# Patient Record
Sex: Male | Born: 1970 | Race: White | Hispanic: No | Marital: Single | Smoking: Never smoker
Health system: Southern US, Community
[De-identification: ages and names within clinical notes are randomized; demographics above are authoritative.]

---

## 2014-09-15 ENCOUNTER — Ambulatory Visit (INDEPENDENT_AMBULATORY_CARE_PROVIDER_SITE_OTHER): Payer: BLUE CROSS/BLUE SHIELD | Admitting: Sports Medicine

## 2014-09-15 VITALS — BP 148/86 | HR 88 | Resp 18 | Ht 75.0 in | Wt 236.0 lb

## 2014-09-15 DIAGNOSIS — R635 Abnormal weight gain: Secondary | ICD-10-CM | POA: Diagnosis not present

## 2014-09-15 DIAGNOSIS — E663 Overweight: Secondary | ICD-10-CM

## 2014-09-15 MED ORDER — PHENTERMINE HCL 37.5 MG PO TABS: ORAL_TABLET | ORAL | Status: DC

## 2014-09-15 NOTE — Assessment & Plan Note (Signed)
Fayrene FearingJames has struggled with weight loss, he has a voracious appetite, he is exercising, he's tried diet, he does a good amount of resistance training. At this point he is a candidate for pharmacologic weight loss, he is in the overweight category, with orthopedic complaints, making him a candidate. Starting phentermine 37.5 mg daily, return monthly for weight checks and refills.

## 2014-09-15 NOTE — Progress Notes (Signed)
  Subjective:    CC: weight loss  HPI:  This is a pleasant 44 year old male,He is healthy, and one of our pharmaceutical reps. He has struggled for years with losing weight, he is trying to diet, exercises regularly, but weight has been stagnant. He is amenable to try pharmacologic weight loss. He has no cardiac history, he has mild hypertension that is controlled. He is also motivated. He also has bilateral knee pain likely related to his weight.  Past medical history, Surgical history, Family history not pertinant except as noted below, Social history, Allergies, and medications have been entered into the medical record, reviewed, and no changes needed.   Review of Systems: No headache, visual changes, nausea, vomiting, diarrhea, constipation, dizziness, abdominal pain, skin rash, fevers, chills, night sweats, swollen lymph nodes, weight loss, chest pain, body aches, joint swelling, muscle aches, shortness of breath, mood changes, visual or auditory hallucinations.  Objective:    General: Well Developed, well nourished, and in no acute distress.  Neuro: Alert and oriented x3, extra-ocular muscles intact, sensation grossly intact.  HEENT: Normocephalic, atraumatic, pupils equal round reactive to light, neck supple, no masses, no lymphadenopathy, thyroid nonpalpable.  Skin: Warm and dry, no rashes noted.  Cardiac: Regular rate and rhythm, no murmurs rubs or gallops.  Respiratory: Clear to auscultation bilaterally. Not using accessory muscles, speaking in full sentences.  Abdominal: Soft, nontender, nondistended, positive bowel sounds, no masses, no organomegaly.  Musculoskeletal: Shoulder, elbow, wrist, hip, knee, ankle stable, and with full range of motion.  Impression and Recommendations:    The patient was counselled, risk factors were discussed, anticipatory guidance given.

## 2016-07-25 ENCOUNTER — Ambulatory Visit (INDEPENDENT_AMBULATORY_CARE_PROVIDER_SITE_OTHER): Payer: BLUE CROSS/BLUE SHIELD | Admitting: Sports Medicine

## 2016-07-25 ENCOUNTER — Ambulatory Visit (INDEPENDENT_AMBULATORY_CARE_PROVIDER_SITE_OTHER): Payer: BLUE CROSS/BLUE SHIELD

## 2016-07-25 DIAGNOSIS — M25561 Pain in right knee: Secondary | ICD-10-CM | POA: Diagnosis not present

## 2016-07-25 DIAGNOSIS — M542 Cervicalgia: Secondary | ICD-10-CM

## 2016-07-25 DIAGNOSIS — I10 Essential (primary) hypertension: Secondary | ICD-10-CM | POA: Diagnosis not present

## 2016-07-25 DIAGNOSIS — M1711 Unilateral primary osteoarthritis, right knee: Secondary | ICD-10-CM

## 2016-07-25 DIAGNOSIS — E291 Testicular hypofunction: Secondary | ICD-10-CM

## 2016-07-25 DIAGNOSIS — M25562 Pain in left knee: Secondary | ICD-10-CM | POA: Diagnosis not present

## 2016-07-25 MED ORDER — AMLODIPINE BESYLATE 10 MG PO TABS: 5.0000 mg | ORAL_TABLET | Freq: Every day | ORAL | 3 refills | Status: DC

## 2016-07-25 MED ORDER — MELOXICAM 15 MG PO TABS: ORAL_TABLET | ORAL | 3 refills | Status: DC

## 2016-07-25 NOTE — Assessment & Plan Note (Signed)
Xrays, mobic.

## 2016-07-25 NOTE — Assessment & Plan Note (Signed)
Mild ED with losartan/HCTZ, switching to amlodipine, we will start with a half of a 10 mg tab, blood pressures before medication ran in the 160s to 170s. Return in 2 weeks for a blood pressure recheck and to consider bumping up to 10 mg. Checking routine blood work as well.

## 2016-07-25 NOTE — Progress Notes (Signed)
  Subjective:    CC: Follow-up  HPI: Hypertension: Currently doing losartan/HCTZ, some resultant erectile dysfunction. Blood pressures are running in the 130s over 90s. Would like to switch to something else. Does get occasional headaches and neck pain when blood pressure is high. No chest pain, visual changes.  Knee pain: Right-sided, patellofemoral, gelling, occasional grinding but no catching, locking. Moderate, persistent.  Neck pain: At the base of the spine, worse with turning the neck from side-to-side with occasional grinding. Has never had imaging.  Past medical history:  Negative.  See flowsheet/record as well for more information.  Surgical history: Negative.  See flowsheet/record as well for more information.  Family history: Negative.  See flowsheet/record as well for more information.  Social history: Negative.  See flowsheet/record as well for more information.  Allergies, and medications have been entered into the medical record, reviewed, and no changes needed.   Review of Systems: No fevers, chills, night sweats, weight loss, chest pain, or shortness of breath.   Objective:    General: Well Developed, well nourished, and in no acute distress.  Neuro: Alert and oriented x3, extra-ocular muscles intact, sensation grossly intact.  HEENT: Normocephalic, atraumatic, pupils equal round reactive to light, neck supple, no masses, no lymphadenopathy, thyroid nonpalpable.  Skin: Warm and dry, no rashes. Cardiac: Regular rate and rhythm, no murmurs rubs or gallops, no lower extremity edema.  Respiratory: Clear to auscultation bilaterally. Not using accessory muscles, speaking in full sentences. Neck: Negative spurling's Full neck range of motion Grip strength and sensation normal in bilateral hands Strength good C4 to T1 distribution No sensory change to C4 to T1 Reflexes normal  Impression and Recommendations:    Benign essential hypertension Mild ED with losartan/HCTZ,  switching to amlodipine, we will start with a half of a 10 mg tab, blood pressures before medication ran in the 160s to 170s. Return in 2 weeks for a blood pressure recheck and to consider bumping up to 10 mg. Checking routine blood work as well.   Neck pain Suspect cervical spondylosis, getting x-rays, neck rehabilitation exercises, meloxicam.   Primary osteoarthritis of right knee Xrays, mobic.

## 2016-07-25 NOTE — Assessment & Plan Note (Signed)
Suspect cervical spondylosis, getting x-rays, neck rehabilitation exercises, meloxicam.

## 2016-07-26 LAB — COMPREHENSIVE METABOLIC PANEL
ALT: 28 U/L (ref 9–46)
Albumin: 4.4 g/dL (ref 3.6–5.1)
Alkaline Phosphatase: 68 U/L (ref 40–115)
CO2: 27 mmol/L (ref 20–31)
Calcium: 9.4 mg/dL (ref 8.6–10.3)
Creat: 0.98 mg/dL (ref 0.60–1.35)
Potassium: 4.2 mmol/L (ref 3.5–5.3)
Sodium: 139 mmol/L (ref 135–146)
Total Protein: 7.6 g/dL (ref 6.1–8.1)

## 2016-07-26 LAB — LIPID PANEL W/REFLEX DIRECT LDL
Cholesterol: 212 mg/dL — ABNORMAL HIGH (ref <200)
HDL: 49 mg/dL (ref >40)
LDL-Cholesterol: 141 mg/dL — ABNORMAL HIGH
Non-HDL Cholesterol (Calc): 163 mg/dL — ABNORMAL HIGH (ref <130)
Total CHOL/HDL Ratio: 4.3 ratio (ref <5.0)
Triglycerides: 108 mg/dL (ref <150)

## 2016-07-26 LAB — CBC
HCT: 42.8 % (ref 38.5–50.0)
Hemoglobin: 14.6 g/dL (ref 13.2–17.1)
MCH: 31.1 pg (ref 27.0–33.0)
MCHC: 34.1 g/dL (ref 32.0–36.0)
MCV: 91.3 fL (ref 80.0–100.0)
MPV: 10.4 fL (ref 7.5–12.5)
Platelets: 210 K/uL (ref 140–400)
RBC: 4.69 MIL/uL (ref 4.20–5.80)
RDW: 13.2 % (ref 11.0–15.0)
WBC: 6.8 K/uL (ref 3.8–10.8)

## 2016-07-26 LAB — TSH: TSH: 1.58 m[IU]/L (ref 0.40–4.50)

## 2016-07-26 LAB — COMPREHENSIVE METABOLIC PANEL WITH GFR
AST: 24 U/L (ref 10–40)
BUN: 17 mg/dL (ref 7–25)
Chloride: 102 mmol/L (ref 98–110)
Glucose, Bld: 94 mg/dL (ref 65–99)
Total Bilirubin: 0.7 mg/dL (ref 0.2–1.2)

## 2016-07-27 LAB — HEMOGLOBIN A1C
Hgb A1c MFr Bld: 5.1 % (ref <5.7)
Mean Plasma Glucose: 100 mg/dL

## 2016-07-27 LAB — VITAMIN D 25 HYDROXY (VIT D DEFICIENCY, FRACTURES): Vit D, 25-Hydroxy: 23 ng/mL — ABNORMAL LOW (ref 30–100)

## 2016-07-29 LAB — TESTOSTERONE TOTAL,FREE,BIO, MALES
Albumin: 4.4 g/dL (ref 3.6–5.1)
Sex Hormone Binding: 13 nmol/L (ref 10–50)
Testosterone, Bioavailable: 128.3 ng/dL (ref 110.0–575.0)
Testosterone, Free: 63.7 pg/mL (ref 46.0–224.0)
Testosterone: 264 ng/dL (ref 250–827)

## 2016-07-30 DIAGNOSIS — E291 Testicular hypofunction: Secondary | ICD-10-CM | POA: Insufficient documentation

## 2016-07-30 NOTE — Addendum Note (Signed)
Addended by: Monica Becton on: 07/30/2016 08:34 AM   Modules accepted: Orders

## 2016-07-30 NOTE — Assessment & Plan Note (Addendum)
Testosterone is borderline low, rechecking.  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

## 2016-08-01 LAB — TESTOSTERONE TOTAL,FREE,BIO, MALES
Albumin: 4.6 g/dL (ref 3.6–5.1)
Sex Hormone Binding: 14 nmol/L (ref 10–50)
Testosterone: 247 ng/dL — ABNORMAL LOW (ref 250–827)

## 2016-08-01 MED ORDER — TESTOSTERONE 40.5 MG/2.5GM (1.62%) TD GEL: 1.0000 "application " | Freq: Every day | TRANSDERMAL | 0 refills | Status: DC

## 2016-08-01 NOTE — Addendum Note (Signed)
Addended by: Monica Becton on: 08/01/2016 04:29 PM   Modules accepted: Orders

## 2016-08-06 ENCOUNTER — Telehealth: Payer: Self-pay | Admitting: *Deleted

## 2016-08-06 NOTE — Telephone Encounter (Signed)
Pre Authorization sent to cover my meds.[id number] - PA Case ID: [id number]. Called and left message on pharm vm.

## 2016-09-23 ENCOUNTER — Ambulatory Visit (INDEPENDENT_AMBULATORY_CARE_PROVIDER_SITE_OTHER): Payer: BLUE CROSS/BLUE SHIELD | Admitting: Sports Medicine

## 2016-09-23 DIAGNOSIS — M1711 Unilateral primary osteoarthritis, right knee: Secondary | ICD-10-CM | POA: Diagnosis not present

## 2016-09-23 NOTE — Progress Notes (Signed)
  Subjective:    CC: Right knee swelling  HPI: This is a pleasant 46 year old male with a history of osteoarthritis, on x-ray. This weekend he was at a postpartum, had several beers, his knee then swelled up, we can free painful and stiff. Pain is moderate, persistent, localized mostly at the medial joint line without mechanical symptoms, he does get gelling.  Past medical history:  Negative.  See flowsheet/record as well for more information.  Surgical history: Negative.  See flowsheet/record as well for more information.  Family history: Negative.  See flowsheet/record as well for more information.  Social history: Negative.  See flowsheet/record as well for more information.  Allergies, and medications have been entered into the medical record, reviewed, and no changes needed.   Review of Systems: No fevers, chills, night sweats, weight loss, chest pain, or shortness of breath.   Objective:    General: Well Developed, well nourished, and in no acute distress.  Neuro: Alert and oriented x3, extra-ocular muscles intact, sensation grossly intact.  HEENT: Normocephalic, atraumatic, pupils equal round reactive to light, neck supple, no masses, no lymphadenopathy, thyroid nonpalpable.  Skin: Warm and dry, no rashes. Cardiac: Regular rate and rhythm, no murmurs rubs or gallops, no lower extremity edema.  Respiratory: Clear to auscultation bilaterally. Not using accessory muscles, speaking in full sentences. Right Knee: Normal to inspection with no erythema or effusion or obvious bony abnormalities. Mild effusion with a small fluid wave, tender to palpation at the medial joint line. ROM normal in flexion and extension and lower leg rotation. Ligaments with solid consistent endpoints including ACL, PCL, LCL, MCL. Negative Mcmurray's and provocative meniscal tests. Non painful patellar compression. Patellar and quadriceps tendons unremarkable. Hamstring and quadriceps strength is  normal.  Procedure: Real-time Ultrasound Guided aspiration/injection of right knee Device: GE Logiq E  Verbal informed consent obtained.  Time-out conducted.  Noted no overlying erythema, induration, or other signs of local infection.  Skin prepped in a sterile fashion.  Local anesthesia: Topical Ethyl chloride.  With sterile technique and under real time ultrasound guidance:  Aspirated 11 mL of clear, straw-colored fluid, syringe switched and 1 mL Kenalog 40, 2 mL lidocaine, 2 mL bupivacaine injected easily. Completed without difficulty  Pain immediately resolved suggesting accurate placement of the medication.  Advised to call if fevers/chills, erythema, induration, drainage, or persistent bleeding.  Images permanently stored and available for review in the ultrasound unit.  Impression: Technically successful ultrasound guided injection.  Impression and Recommendations:    Primary osteoarthritis of right knee Swelling after drinking some alcohol, consistent with gout. Arthrocentesis was performed with injection, the fluid did not look like gouty fluid. Crystal analysis ordered, injection performed, return in one month. If uric acid crystals isolated and we will check a serum uric acid level.

## 2016-09-23 NOTE — Assessment & Plan Note (Signed)
Swelling after drinking some alcohol, consistent with gout. Arthrocentesis was performed with injection, the fluid did not look like gouty fluid. Crystal analysis ordered, injection performed, return in one month. If uric acid crystals isolated and we will check a serum uric acid level.

## 2016-09-24 LAB — SYNOVIAL CELL COUNT + DIFF, W/ CRYSTALS
Basophils, %: 0 %
Eosinophils-Synovial: 0 % (ref 0–2)
Lymphocytes-Synovial Fld: 31 % (ref 0–74)
Monocyte/Macrophage: 52 % (ref 0–69)
Neutrophil, Synovial: 16 % (ref 0–24)
Synoviocytes, %: 1 % (ref 0–15)
WBC, Synovial: 126 cells/uL (ref <150)

## 2016-11-18 ENCOUNTER — Other Ambulatory Visit: Payer: Self-pay

## 2016-11-18 DIAGNOSIS — I10 Essential (primary) hypertension: Secondary | ICD-10-CM

## 2016-11-18 MED ORDER — AMLODIPINE BESYLATE 10 MG PO TABS: 10.0000 mg | ORAL_TABLET | Freq: Every day | ORAL | 3 refills | Status: DC

## 2017-01-13 ENCOUNTER — Other Ambulatory Visit: Payer: Self-pay | Admitting: Sports Medicine

## 2017-01-13 DIAGNOSIS — M542 Cervicalgia: Secondary | ICD-10-CM

## 2017-03-06 ENCOUNTER — Other Ambulatory Visit: Payer: Self-pay | Admitting: Sports Medicine

## 2017-03-06 DIAGNOSIS — M542 Cervicalgia: Secondary | ICD-10-CM

## 2017-04-10 ENCOUNTER — Telehealth: Payer: Self-pay

## 2017-04-10 NOTE — Telephone Encounter (Signed)
Pt states his bp has been running from 160/100 with medication would like to know what would be the next step without causing any side effects.

## 2017-04-11 MED ORDER — LISINOPRIL-HYDROCHLOROTHIAZIDE 20-25 MG PO TABS: ORAL_TABLET | ORAL | 3 refills | Status: DC

## 2017-04-11 NOTE — Telephone Encounter (Signed)
If this is while taking the 10 mg of amlodipine we do need to add an ACE inhibitor/diuretic combination.  I am going to add lisinopril/HCTZ, I am going to called in at the full dose and he should do a half dose for 1-2 weeks and if the blood pressure is still elevated above 120/80 go up to a full dose.  He also needs to let me know his blood pressures along the way.

## 2017-09-17 ENCOUNTER — Other Ambulatory Visit: Payer: Self-pay | Admitting: Sports Medicine

## 2017-09-17 MED ORDER — VALSARTAN-HYDROCHLOROTHIAZIDE 320-25 MG PO TABS: 1.0000 | ORAL_TABLET | Freq: Every day | ORAL | 3 refills | Status: DC

## 2017-09-17 NOTE — Telephone Encounter (Signed)
Cough on ACE, switching to ARB

## 2017-11-20 ENCOUNTER — Other Ambulatory Visit: Payer: Self-pay | Admitting: Sports Medicine

## 2017-11-27 ENCOUNTER — Telehealth: Payer: Self-pay | Admitting: Sports Medicine

## 2017-11-27 ENCOUNTER — Encounter: Payer: Self-pay | Admitting: Sports Medicine

## 2017-11-27 ENCOUNTER — Ambulatory Visit (INDEPENDENT_AMBULATORY_CARE_PROVIDER_SITE_OTHER): Payer: BLUE CROSS/BLUE SHIELD | Admitting: Sports Medicine

## 2017-11-27 DIAGNOSIS — M1711 Unilateral primary osteoarthritis, right knee: Secondary | ICD-10-CM | POA: Diagnosis not present

## 2017-11-27 NOTE — Progress Notes (Signed)
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  I injected his knee about 14 months ago, last month he had a recurrence of pain, moderate, persistent, localized at the medial joint line without radiation, no mechanical symptoms, moderate gelling.  He has tried oral NSAIDs, analgesics without much efficacy.  I reviewed the past medical history, family history, social history, surgical history, and allergies today and no changes were needed.  Please see the problem list section below in epic for further details.  Past Medical History: No past medical history on file. Past Surgical History: History reviewed. No pertinent surgical history. Social History: Social History   Socioeconomic History  . Marital status: Single    Spouse name: Not on file  . Number of children: Not on file  . Years of education: Not on file  . Highest education level: Not on file  Occupational History  . Not on file  Social Needs  . Financial resource strain: Not on file  . Food insecurity:    Worry: Not on file    Inability: Not on file  . Transportation needs:    Medical: Not on file    Non-medical: Not on file  Tobacco Use  . Smoking status: Never Smoker  . Smokeless tobacco: Never Used  Substance and Sexual Activity  . Alcohol use: Not Currently  . Drug use: Never  . Sexual activity: Not on file  Lifestyle  . Physical activity:    Days per week: Not on file    Minutes per session: Not on file  . Stress: Not on file  Relationships  . Social connections:    Talks on phone: Not on file    Gets together: Not on file    Attends religious service: Not on file    Active member of club or organization: Not on file    Attends meetings of clubs or organizations: Not on file    Relationship status: Not on file  Other Topics Concern  . Not on file  Social History Narrative  . Not on file   Family  History: No family history on file. Allergies: Allergies  Allergen Reactions  . Ace Inhibitors Cough   Medications: See med rec.  Review of Systems: No fevers, chills, night sweats, weight loss, chest pain, or shortness of breath.   Objective:    General: Well Developed, well nourished, and in no acute distress.  Neuro: Alert and oriented x3, extra-ocular muscles intact, sensation grossly intact.  HEENT: Normocephalic, atraumatic, pupils equal round reactive to light, neck supple, no masses, no lymphadenopathy, thyroid nonpalpable.  Skin: Warm and dry, no rashes. Cardiac: Regular rate and rhythm, no murmurs rubs or gallops, no lower extremity edema.  Respiratory: Clear to auscultation bilaterally. Not using accessory muscles, speaking in full sentences. Right knee: Normal to inspection with no erythema or effusion or obvious bony abnormalities. Tender to palpation at the medial joint line ROM normal in flexion and extension and lower leg rotation. Ligaments with solid consistent endpoints including ACL, PCL, LCL, MCL. Negative Mcmurray's and provocative meniscal tests. Non painful patellar compression. Patellar and quadriceps tendons unremarkable. Hamstring and quadriceps strength is normal.  Procedure: Real-time Ultrasound Guided Injection of right knee  device: GE Logiq E  Verbal informed consent obtained.  Time-out conducted.  Noted no overlying erythema, induration, or other signs of local infection.  Skin prepped in a sterile  fashion.  Local anesthesia: Topical Ethyl chloride.  With sterile technique and under real time ultrasound guidance: Using a 25-gauge needle advanced into the suprapatellar recess and injected 1 cc Kenalog 40, 2 cc lidocaine, 2 cc bupivacaine. Completed without difficulty  Pain immediately resolved suggesting accurate placement of the medication.  Advised to call if fevers/chills, erythema, induration, drainage, or persistent bleeding.  Images  permanently stored and available for review in the ultrasound unit.  Impression: Technically successful ultrasound guided injection.  Impression and Recommendations:    Primary osteoarthritis of right knee Previous injection was 14 months ago. Repeat right knee injection today, adding Pennsaid (diclofenac 2% topical), [facility] approval, rehab exercises. Return to see me as needed.  ___________________________________________ Ihor Austinhomas J. Benjamin Stainhekkekandam, M.D., ABFM., CAQSM. Primary Care and Sports Medicine [facility] [facility]  Adjunct Instructor of Family Medicine  [employer]

## 2017-11-27 NOTE — Telephone Encounter (Signed)
-----   Message from Neldon LabellaHolden S Mabe, CMA sent at 11/27/2017 11:15 AM EDT ----- Information has been submitted to [facility] and awaiting determination.   ----- Message ----- From: Monica Bectonhekkekandam, Thomas J, MD Sent: 11/27/2017   9:04 AM EDT To: Neldon LabellaHolden S Mabe, CMA  Orthovisc approval please, right knee, x-ray confirmed, failed steroid injections and NSAIDs. ___________________________________________ Ihor Austinhomas J. Benjamin Stainhekkekandam, M.D., ABFM., CAQSM. Primary Care and Sports Medicine [employer] [facility]  Adjunct Instructor of Family Medicine  [employer]

## 2017-11-27 NOTE — Assessment & Plan Note (Signed)
Previous injection was 14 months ago. Repeat right knee injection today, adding Pennsaid (diclofenac 2% topical), [facility] approval, rehab exercises. Return to see me as needed.

## 2017-12-11 NOTE — Telephone Encounter (Signed)
Patient wants to think over this and then discuss with Dr. Karie Schwalbe at a later date. He will call back to make an appointment if he decides to go ahead.

## 2017-12-11 NOTE — Telephone Encounter (Signed)
Orthovisc is not covered and I called insurance does not cover speciality injections. Please advise.   I have left the patient a message to call.

## 2017-12-11 NOTE — Telephone Encounter (Signed)
The next option is to try PRP, evidence is not great, and it is $470, generally not covered by insurance.  All of this is to try to keep him out of the operating room.

## 2018-02-01 ENCOUNTER — Other Ambulatory Visit: Payer: Self-pay | Admitting: Sports Medicine

## 2018-02-03 ENCOUNTER — Other Ambulatory Visit: Payer: Self-pay | Admitting: Sports Medicine

## 2018-02-03 DIAGNOSIS — I10 Essential (primary) hypertension: Secondary | ICD-10-CM

## 2018-03-01 ENCOUNTER — Other Ambulatory Visit: Payer: Self-pay | Admitting: Sports Medicine

## 2018-03-02 ENCOUNTER — Other Ambulatory Visit: Payer: Self-pay | Admitting: Sports Medicine

## 2018-03-02 MED ORDER — VALSARTAN-HYDROCHLOROTHIAZIDE 320-25 MG PO TABS: 1.0000 | ORAL_TABLET | Freq: Every day | ORAL | 3 refills | Status: DC

## 2018-04-03 ENCOUNTER — Other Ambulatory Visit: Payer: Self-pay | Admitting: Sports Medicine

## 2018-05-09 ENCOUNTER — Other Ambulatory Visit: Payer: Self-pay | Admitting: Sports Medicine

## 2018-05-15 ENCOUNTER — Other Ambulatory Visit: Payer: Self-pay | Admitting: Sports Medicine

## 2018-11-26 IMAGING — DX DG CERVICAL SPINE COMPLETE 4+V
6 series · 6 of 6 positions shown · non-contrast
Comparison: No prior.

CLINICAL DATA: Pain at base of skull.  No known injury.

EXAM:
CERVICAL SPINE - COMPLETE 4+ VIEW

[c-spine lat]
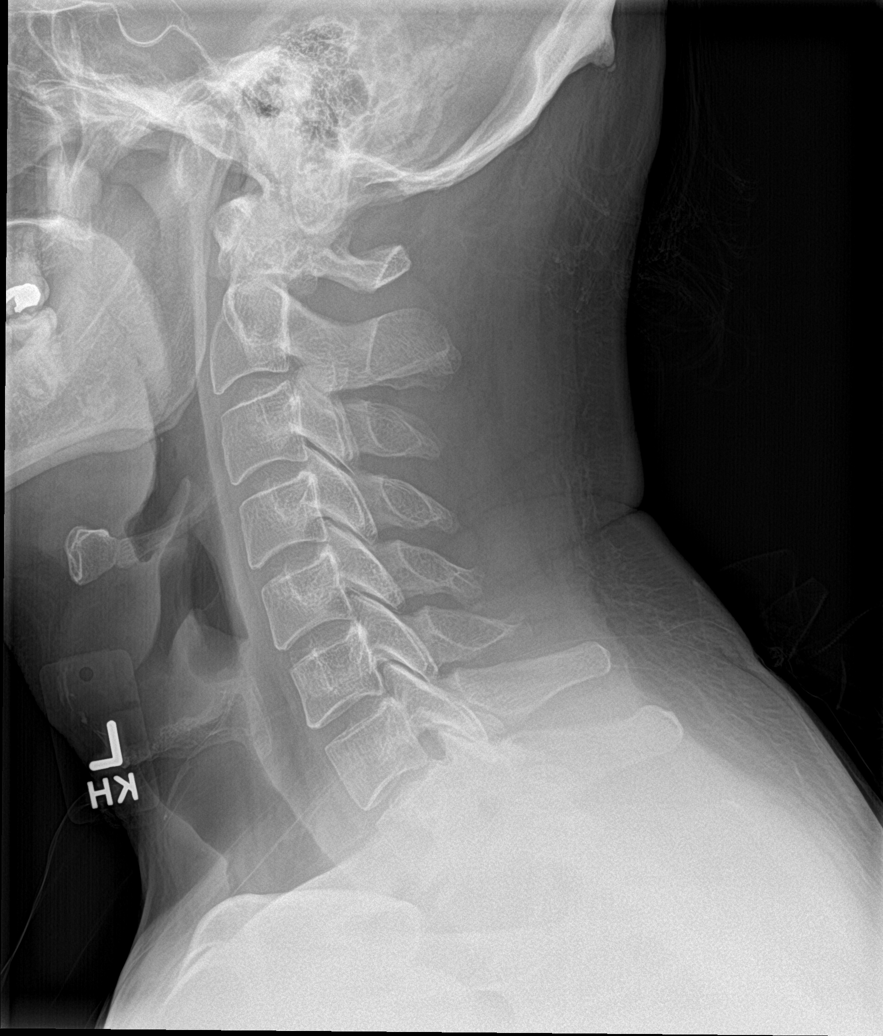

[c-spine obl (1 of 2)]
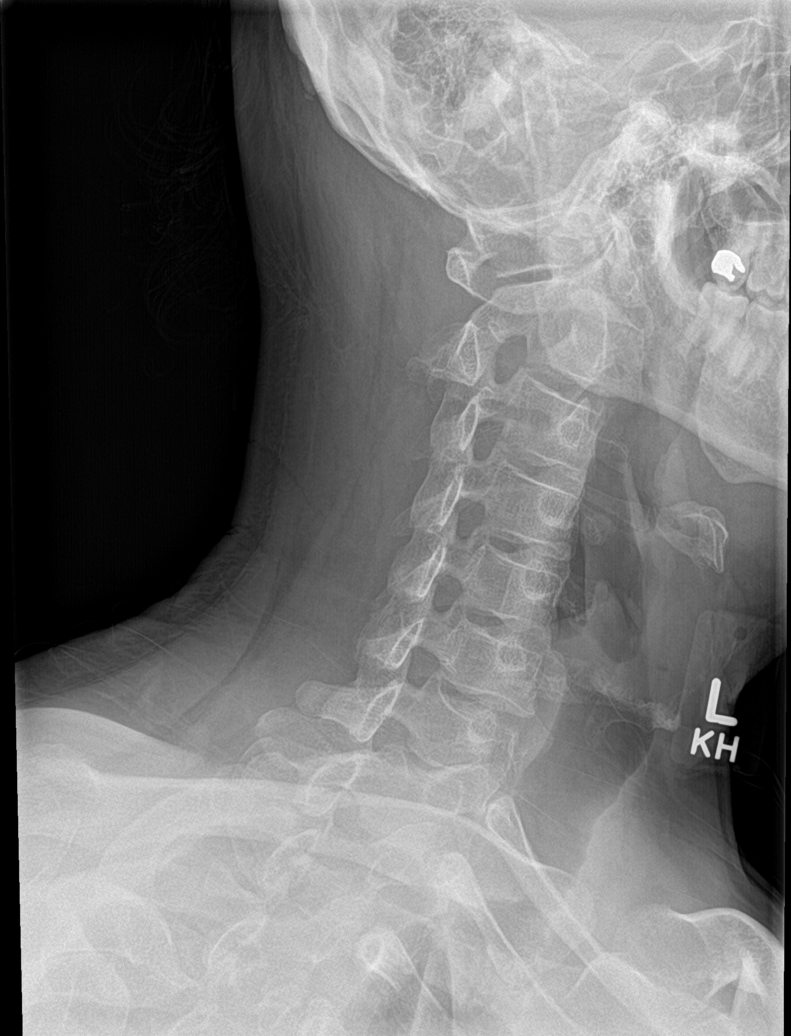

[c-spine obl (2 of 2)]
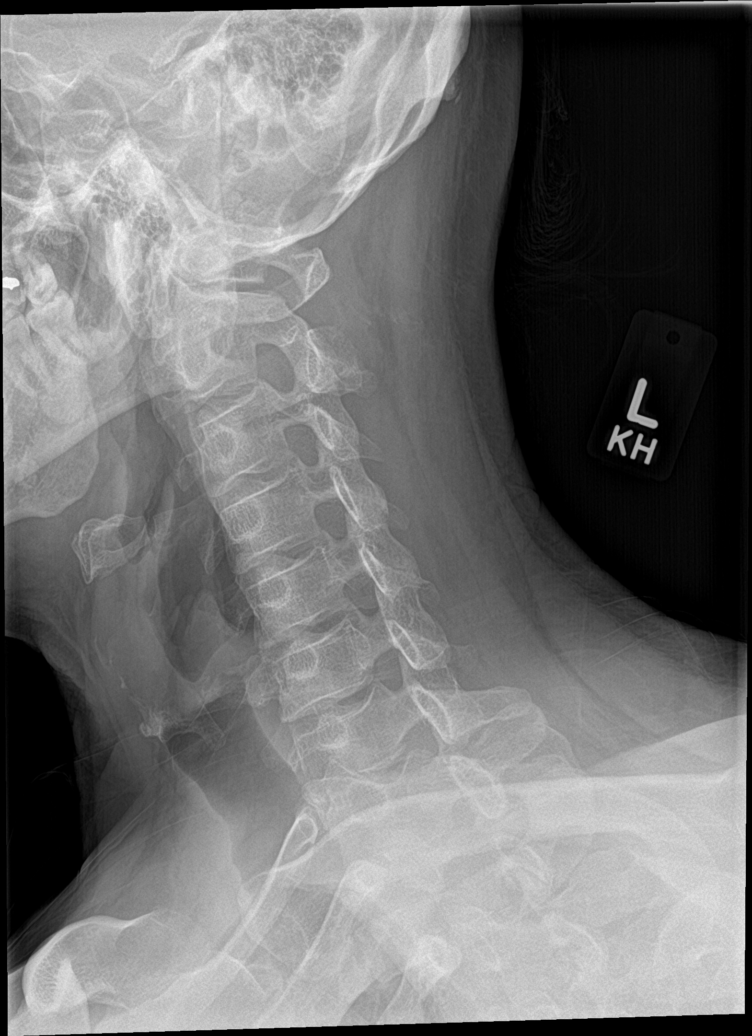

[c-spine ap]
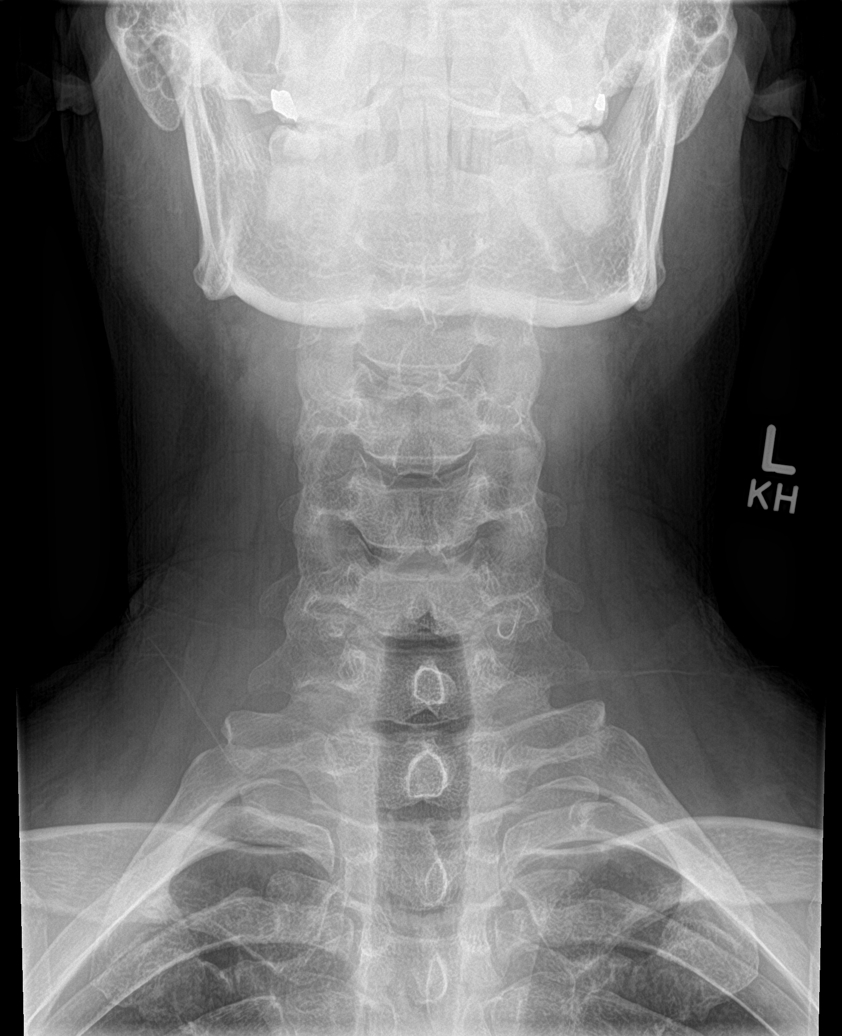

[c-spine open mouth]
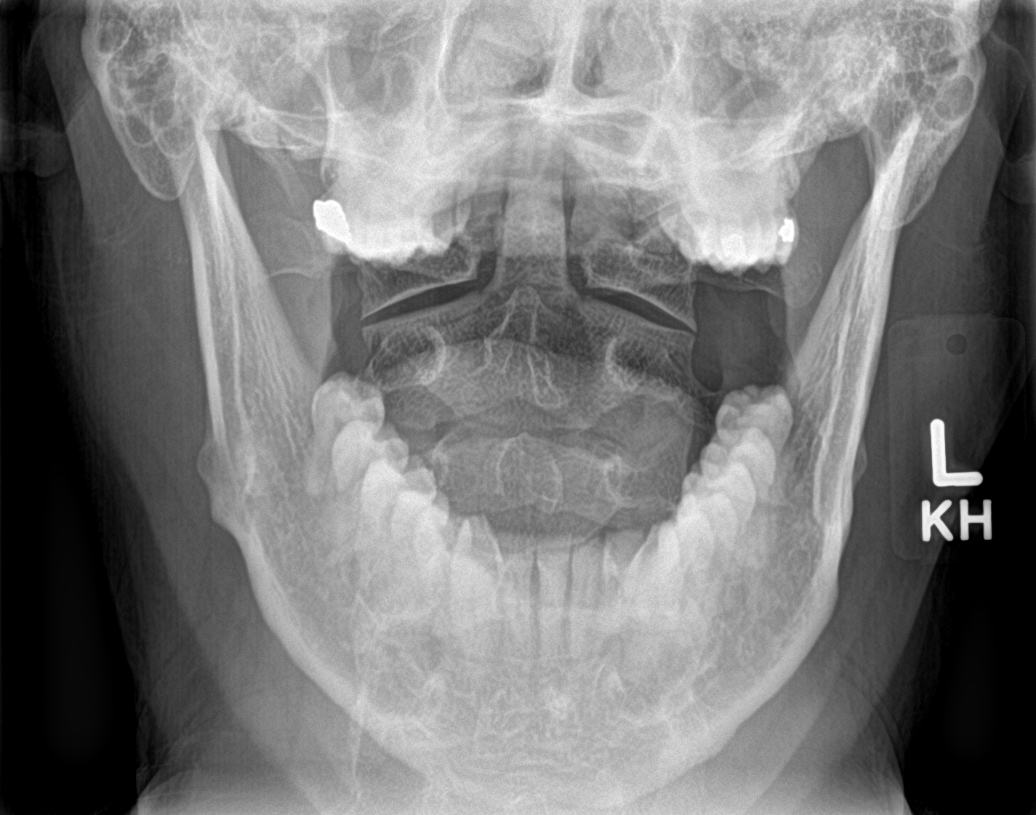

[c-spine swimmers]
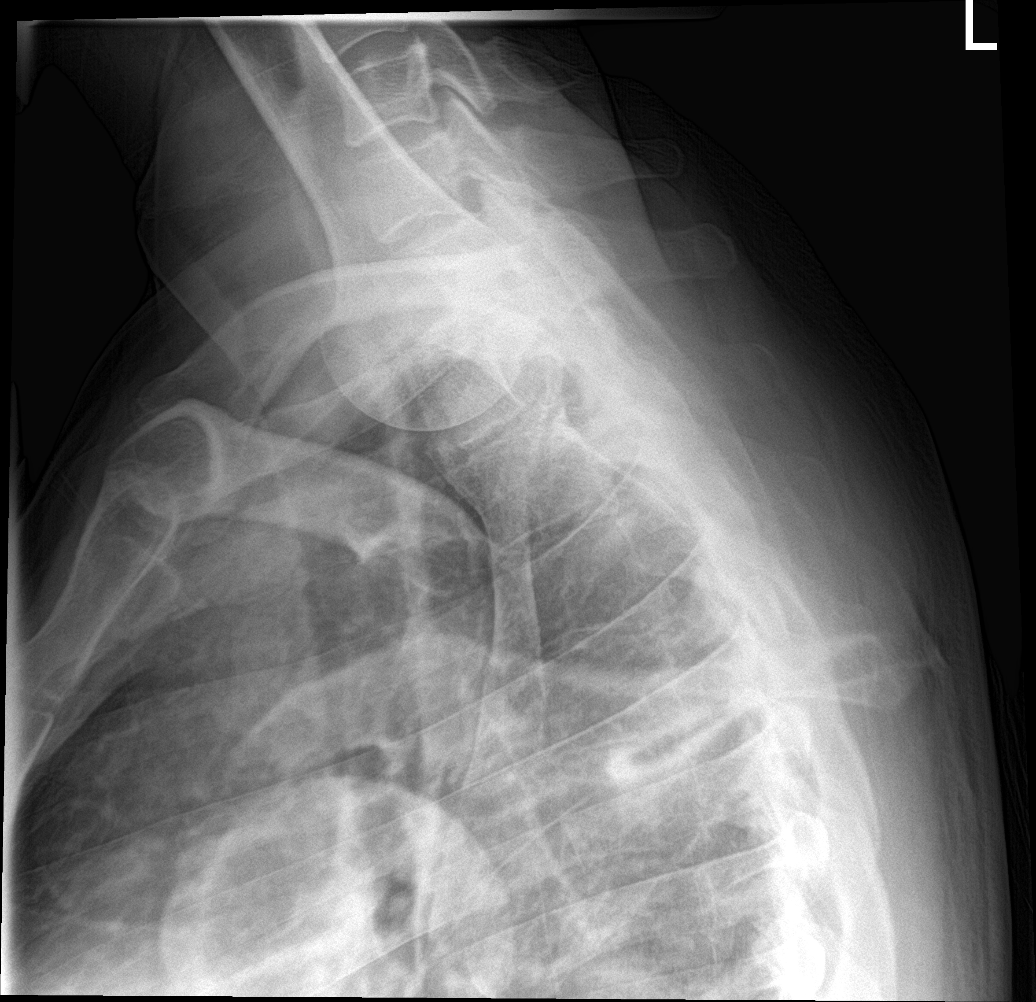

[6 of 6 positions shown; findings below may reference images not displayed]

FINDINGS: No acute bony or joint abnormality identified. No evidence of
fracture or dislocation. Normal alignment. Pulmonary apices are
clear.
IMPRESSION: No acute abnormality.

## 2019-02-01 ENCOUNTER — Other Ambulatory Visit: Payer: Self-pay | Admitting: *Deleted

## 2019-02-01 MED ORDER — VALSARTAN-HYDROCHLOROTHIAZIDE 320-25 MG PO TABS: 1.0000 | ORAL_TABLET | Freq: Every day | ORAL | 0 refills | Status: DC

## 2019-04-05 ENCOUNTER — Other Ambulatory Visit: Payer: Self-pay | Admitting: Sports Medicine

## 2019-04-05 DIAGNOSIS — I10 Essential (primary) hypertension: Secondary | ICD-10-CM

## 2019-04-05 DIAGNOSIS — E291 Testicular hypofunction: Secondary | ICD-10-CM

## 2019-04-05 MED ORDER — VALSARTAN-HYDROCHLOROTHIAZIDE 320-25 MG PO TABS: 1.0000 | ORAL_TABLET | Freq: Every day | ORAL | 0 refills | Status: DC

## 2019-04-05 MED ORDER — AMLODIPINE BESYLATE 10 MG PO TABS: 10.0000 mg | ORAL_TABLET | Freq: Every day | ORAL | 0 refills | Status: DC

## 2019-04-05 NOTE — Assessment & Plan Note (Signed)
Steve Rollins understands he needs to get in for a physical and for labs, refilling his blood pressure medication for now. Fasting labs ordered.

## 2019-05-04 ENCOUNTER — Other Ambulatory Visit: Payer: Self-pay | Admitting: Sports Medicine

## 2019-05-04 ENCOUNTER — Other Ambulatory Visit: Payer: Self-pay | Admitting: *Deleted

## 2019-05-04 MED ORDER — VALSARTAN-HYDROCHLOROTHIAZIDE 320-25 MG PO TABS: 1.0000 | ORAL_TABLET | Freq: Every day | ORAL | 0 refills | Status: DC

## 2019-06-11 ENCOUNTER — Telehealth: Payer: Self-pay | Admitting: Sports Medicine

## 2019-06-11 MED ORDER — VALSARTAN-HYDROCHLOROTHIAZIDE 320-25 MG PO TABS: 1.0000 | ORAL_TABLET | Freq: Every day | ORAL | 0 refills | Status: DC

## 2019-06-11 NOTE — Telephone Encounter (Signed)
Steve Rollins has not been seen in some time, he agrees to get into the office for labs and visit by the end of the month , I am going to refill 30 days of his medication.

## 2019-06-29 ENCOUNTER — Other Ambulatory Visit: Payer: Self-pay

## 2019-06-29 ENCOUNTER — Encounter: Payer: Self-pay | Admitting: Sports Medicine

## 2019-06-29 ENCOUNTER — Ambulatory Visit (INDEPENDENT_AMBULATORY_CARE_PROVIDER_SITE_OTHER): Payer: BC Managed Care – PPO | Admitting: Sports Medicine

## 2019-06-29 DIAGNOSIS — I10 Essential (primary) hypertension: Secondary | ICD-10-CM | POA: Diagnosis not present

## 2019-06-29 MED ORDER — VALSARTAN-HYDROCHLOROTHIAZIDE 320-25 MG PO TABS: 1.0000 | ORAL_TABLET | Freq: Every day | ORAL | 3 refills | Status: DC

## 2019-06-29 NOTE — Progress Notes (Signed)
    Procedures performed today:    None.  Independent interpretation of notes and tests performed by another provider:   None.  Impression and Recommendations:    Benign essential hypertension Steve Rollins returns, we are restarting his blood pressure medications, valsartan/HCTZ, he has not been taking his amlodipine. He will get his labs today. Follow-up in a year. Restarting amlodipine if still elevated, he will check his blood pressures at home.    ___________________________________________ Ihor Austin. Benjamin Stain, M.D., ABFM., CAQSM. Primary Care and Sports Medicine [facility]  Adjunct Instructor of Family Medicine  [employer]

## 2019-06-29 NOTE — Assessment & Plan Note (Addendum)
Mack returns, we are restarting his blood pressure medications, valsartan/HCTZ, he has not been taking his amlodipine. He will get his labs today. Follow-up in a year. Restarting amlodipine if still elevated, he will check his blood pressures at home.

## 2019-06-30 ENCOUNTER — Other Ambulatory Visit: Payer: Self-pay | Admitting: Sports Medicine

## 2019-06-30 MED ORDER — VITAMIN D (ERGOCALCIFEROL) 1.25 MG (50000 UNIT) PO CAPS: 50000.0000 [IU] | ORAL_CAPSULE | ORAL | 0 refills | Status: DC

## 2019-07-06 LAB — COMPLETE METABOLIC PANEL WITH GFR
AG Ratio: 1.5 (calc) (ref 1.0–2.5)
ALT: 47 U/L — ABNORMAL HIGH (ref 9–46)
AST: 58 U/L — ABNORMAL HIGH (ref 10–40)
Albumin: 4.9 g/dL (ref 3.6–5.1)
Alkaline phosphatase (APISO): 71 U/L (ref 36–130)
BUN: 13 mg/dL (ref 7–25)
CO2: 27 mmol/L (ref 20–32)
Calcium: 10.2 mg/dL (ref 8.6–10.3)
Chloride: 100 mmol/L (ref 98–110)
Creat: 1 mg/dL (ref 0.60–1.35)
GFR, Est African American: 103 mL/min/{1.73_m2} (ref >=60)
GFR, Est Non African American: 89 mL/min/{1.73_m2} (ref >=60)
Globulin: 3.2 g/dL (calc) (ref 1.9–3.7)
Glucose, Bld: 95 mg/dL (ref 65–139)
Potassium: 4.3 mmol/L (ref 3.5–5.3)
Sodium: 138 mmol/L (ref 135–146)
Total Bilirubin: 0.5 mg/dL (ref 0.2–1.2)
Total Protein: 8.1 g/dL (ref 6.1–8.1)

## 2019-07-06 LAB — LIPID PANEL W/REFLEX DIRECT LDL
Cholesterol: 250 mg/dL — ABNORMAL HIGH (ref <200)
HDL: 50 mg/dL (ref >=40)
LDL Cholesterol (Calc): 173 mg/dL (calc) — ABNORMAL HIGH
Non-HDL Cholesterol (Calc): 200 mg/dL (calc) — ABNORMAL HIGH (ref <130)
Total CHOL/HDL Ratio: 5 (calc) — ABNORMAL HIGH (ref <5.0)
Triglycerides: 137 mg/dL (ref <150)

## 2019-07-06 LAB — PSA, TOTAL AND FREE
PSA, % Free: 6 % (calc) — ABNORMAL LOW (ref >25)
PSA, Free: 0.1 ng/mL
PSA, Total: 1.7 ng/mL (ref <=4.0)

## 2019-07-06 LAB — HEMOGLOBIN A1C
Hgb A1c MFr Bld: 5.3 % of total Hgb (ref <5.7)
Mean Plasma Glucose: 105 (calc)
eAG (mmol/L): 5.8 (calc)

## 2019-07-06 LAB — CBC
HCT: 46.4 % (ref 38.5–50.0)
Hemoglobin: 15.8 g/dL (ref 13.2–17.1)
MCH: 31.9 pg (ref 27.0–33.0)
MCHC: 34.1 g/dL (ref 32.0–36.0)
MCV: 93.5 fL (ref 80.0–100.0)
MPV: 10.9 fL (ref 7.5–12.5)
Platelets: 250 10*3/uL (ref 140–400)
RBC: 4.96 10*6/uL (ref 4.20–5.80)
RDW: 12.6 % (ref 11.0–15.0)
WBC: 7.5 10*3/uL (ref 3.8–10.8)

## 2019-07-06 LAB — VITAMIN D 25 HYDROXY (VIT D DEFICIENCY, FRACTURES): Vit D, 25-Hydroxy: 14 ng/mL — ABNORMAL LOW (ref 30–100)

## 2019-07-06 LAB — TSH: TSH: 1.52 mIU/L (ref 0.40–4.50)

## 2019-07-06 LAB — THYROID STIMULATING IMMUNOGLOBULIN: TSI: <89 % baseline (ref <140)

## 2020-05-11 ENCOUNTER — Ambulatory Visit (INDEPENDENT_AMBULATORY_CARE_PROVIDER_SITE_OTHER): Payer: No Typology Code available for payment source | Admitting: Sports Medicine

## 2020-05-11 ENCOUNTER — Encounter: Payer: Self-pay | Admitting: Sports Medicine

## 2020-05-11 VITALS — BP 146/85 | HR 78 | Wt 245.0 lb

## 2020-05-11 DIAGNOSIS — R0789 Other chest pain: Secondary | ICD-10-CM | POA: Diagnosis not present

## 2020-05-11 DIAGNOSIS — R079 Chest pain, unspecified: Secondary | ICD-10-CM | POA: Diagnosis not present

## 2020-05-11 LAB — TROPONIN I: Troponin I: 5 ng/L (ref <=47)

## 2020-05-11 LAB — BRAIN NATRIURETIC PEPTIDE: Brain Natriuretic Peptide: <4 pg/mL (ref <100)

## 2020-05-11 MED ORDER — ALPRAZOLAM 0.5 MG PO TABS: 0.5000 mg | ORAL_TABLET | Freq: Two times a day (BID) | ORAL | 0 refills | Status: DC | PRN

## 2020-05-11 MED ORDER — ASPIRIN EC 81 MG PO TBEC: 81.0000 mg | DELAYED_RELEASE_TABLET | Freq: Every day | ORAL | 3 refills | Status: DC

## 2020-05-11 MED ORDER — METOPROLOL SUCCINATE ER 25 MG PO TB24: 25.0000 mg | ORAL_TABLET | Freq: Every day | ORAL | 11 refills | Status: DC

## 2020-05-11 NOTE — Progress Notes (Signed)
    Procedures performed today:    Twelve-lead ECG performed and interpreted by me, normal rate, normal rhythm, normal axis, slight intraventricular conduction delay, no ST changes, no PR abnormalities.  Independent interpretation of notes and tests performed by another provider:   None.  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 Lately he is having chest pain, precordial, left-sided and sometimes across the chest, not typically with exertion, it often occurs at rest without nausea, diaphoresis, he does get occasional palpitations, and it is occasionally pleuritic. He did go hiking in [place] and really did not have any discomfort during physical exertion. He did get a promotion in his job that has resulted in better pay but also significantly more stress. Today he had a twelve-lead ECG, results are as above. We will go ahead and risk stratify him with labs including a D-dimer and a BNP. I would like a treadmill stress test, chest x-ray, echocardiogram. Considering his elevated blood pressure we are going to add a beta-blocker, Toprol-XL and a baby aspirin. I like to see him back after the work-up is done to go over it. I am also going to add a bit of alprazolam 0.5 mg to be used as needed.  Level 5 coded as we are managing a potentially life-threatening process, this is separate from the time spent performing and interpreting the ECG.   ___________________________________________ Ihor Austin. Benjamin Stain, M.D., ABFM., CAQSM. Primary Care and Sports Medicine [place] [facility]  Adjunct Instructor of Family Medicine  [employer]

## 2020-05-11 NOTE — Assessment & Plan Note (Signed)
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 Lately he is having chest pain, precordial, left-sided and sometimes across the chest, not typically with exertion, it often occurs at rest without nausea, diaphoresis, he does get occasional palpitations, and it is occasionally pleuritic. He did go hiking in [place] and really did not have any discomfort during physical exertion. He did get a promotion in his job that has resulted in better pay but also significantly more stress. Today he had a twelve-lead ECG, results are as above. We will go ahead and risk stratify him with labs including a D-dimer and a BNP. I would like a treadmill stress test, chest x-ray, echocardiogram. Considering his elevated blood pressure we are going to add a beta-blocker, Toprol-XL and a baby aspirin. I like to see him back after the work-up is done to go over it. I am also going to add a bit of alprazolam 0.5 mg to be used as needed.

## 2020-05-12 LAB — COMPREHENSIVE METABOLIC PANEL
AG Ratio: 1.5 (calc) (ref 1.0–2.5)
ALT: 53 U/L — ABNORMAL HIGH (ref 9–46)
AST: 34 U/L (ref 10–40)
Albumin: 5 g/dL (ref 3.6–5.1)
Alkaline phosphatase (APISO): 83 U/L (ref 36–130)
BUN: 17 mg/dL (ref 7–25)
CO2: 28 mmol/L (ref 20–32)
Calcium: 10.1 mg/dL (ref 8.6–10.3)
Chloride: 101 mmol/L (ref 98–110)
Creat: 0.99 mg/dL (ref 0.60–1.35)
Globulin: 3.3 g/dL (calc) (ref 1.9–3.7)
Glucose, Bld: 93 mg/dL (ref 65–99)
Potassium: 4 mmol/L (ref 3.5–5.3)
Sodium: 138 mmol/L (ref 135–146)
Total Bilirubin: 0.6 mg/dL (ref 0.2–1.2)
Total Protein: 8.3 g/dL — ABNORMAL HIGH (ref 6.1–8.1)

## 2020-05-12 LAB — CBC
HCT: 45.9 % (ref 38.5–50.0)
Hemoglobin: 16.3 g/dL (ref 13.2–17.1)
MCH: 32.3 pg (ref 27.0–33.0)
MCHC: 35.5 g/dL (ref 32.0–36.0)
MCV: 91.1 fL (ref 80.0–100.0)
MPV: 10.7 fL (ref 7.5–12.5)
Platelets: 259 10*3/uL (ref 140–400)
RBC: 5.04 10*6/uL (ref 4.20–5.80)
RDW: 12.3 % (ref 11.0–15.0)
WBC: 9.2 10*3/uL (ref 3.8–10.8)

## 2020-05-12 LAB — TSH: TSH: 1.62 mIU/L (ref 0.40–4.50)

## 2020-05-12 LAB — D-DIMER, QUANTITATIVE: D-Dimer, Quant: 0.25 mcg/mL FEU (ref <0.50)

## 2020-05-12 LAB — CK TOTAL AND CKMB (NOT AT ARMC)
CK, MB: 0.8 ng/mL (ref 0–5.0)
Relative Index: 1.7 (ref 0–4.0)
Total CK: 46 U/L (ref 44–196)

## 2020-06-14 ENCOUNTER — Ambulatory Visit (HOSPITAL_BASED_OUTPATIENT_CLINIC_OR_DEPARTMENT_OTHER): Payer: No Typology Code available for payment source

## 2020-06-28 ENCOUNTER — Telehealth: Payer: Self-pay | Admitting: Sports Medicine

## 2020-06-28 DIAGNOSIS — R0789 Other chest pain: Secondary | ICD-10-CM

## 2020-06-28 NOTE — Telephone Encounter (Signed)
Steve Rollins is here and wants to discuss his treadmill stress test, it sounds like [place] is out of network so we will try [facility].

## 2020-06-30 ENCOUNTER — Other Ambulatory Visit: Payer: Self-pay | Admitting: Sports Medicine

## 2020-06-30 DIAGNOSIS — R0789 Other chest pain: Secondary | ICD-10-CM

## 2020-07-14 ENCOUNTER — Other Ambulatory Visit: Payer: Self-pay | Admitting: Sports Medicine

## 2020-07-14 DIAGNOSIS — I10 Essential (primary) hypertension: Secondary | ICD-10-CM

## 2020-07-14 DIAGNOSIS — R0789 Other chest pain: Secondary | ICD-10-CM

## 2020-07-14 MED ORDER — VALSARTAN-HYDROCHLOROTHIAZIDE 320-25 MG PO TABS
1.0000 | ORAL_TABLET | Freq: Every day | ORAL | 3 refills | Status: DC
Start: 2020-07-14 — End: 2021-08-15

## 2020-07-14 MED ORDER — METOPROLOL SUCCINATE ER 25 MG PO TB24
25.0000 mg | ORAL_TABLET | Freq: Every day | ORAL | 3 refills | Status: DC
Start: 2020-07-14 — End: 2021-08-15

## 2020-07-14 MED ORDER — ALPRAZOLAM 0.5 MG PO TABS: 0.5000 mg | ORAL_TABLET | Freq: Two times a day (BID) | ORAL | 0 refills | Status: DC | PRN

## 2020-07-14 MED ORDER — ASPIRIN EC 81 MG PO TBEC: 81.0000 mg | DELAYED_RELEASE_TABLET | Freq: Every day | ORAL | 3 refills | Status: DC

## 2020-10-31 DIAGNOSIS — M6240 Contracture of muscle, unspecified site: Secondary | ICD-10-CM | POA: Diagnosis not present

## 2020-10-31 DIAGNOSIS — M9903 Segmental and somatic dysfunction of lumbar region: Secondary | ICD-10-CM | POA: Diagnosis not present

## 2020-10-31 DIAGNOSIS — M9904 Segmental and somatic dysfunction of sacral region: Secondary | ICD-10-CM | POA: Diagnosis not present

## 2020-10-31 DIAGNOSIS — M9905 Segmental and somatic dysfunction of pelvic region: Secondary | ICD-10-CM | POA: Diagnosis not present

## 2020-11-01 DIAGNOSIS — M6240 Contracture of muscle, unspecified site: Secondary | ICD-10-CM | POA: Diagnosis not present

## 2020-11-01 DIAGNOSIS — M9905 Segmental and somatic dysfunction of pelvic region: Secondary | ICD-10-CM | POA: Diagnosis not present

## 2020-11-01 DIAGNOSIS — M9904 Segmental and somatic dysfunction of sacral region: Secondary | ICD-10-CM | POA: Diagnosis not present

## 2020-11-01 DIAGNOSIS — M9903 Segmental and somatic dysfunction of lumbar region: Secondary | ICD-10-CM | POA: Diagnosis not present

## 2020-11-13 DIAGNOSIS — M9904 Segmental and somatic dysfunction of sacral region: Secondary | ICD-10-CM | POA: Diagnosis not present

## 2020-11-13 DIAGNOSIS — M9903 Segmental and somatic dysfunction of lumbar region: Secondary | ICD-10-CM | POA: Diagnosis not present

## 2020-11-13 DIAGNOSIS — M6240 Contracture of muscle, unspecified site: Secondary | ICD-10-CM | POA: Diagnosis not present

## 2020-11-13 DIAGNOSIS — M9905 Segmental and somatic dysfunction of pelvic region: Secondary | ICD-10-CM | POA: Diagnosis not present

## 2020-11-15 DIAGNOSIS — M9903 Segmental and somatic dysfunction of lumbar region: Secondary | ICD-10-CM | POA: Diagnosis not present

## 2020-11-15 DIAGNOSIS — M9904 Segmental and somatic dysfunction of sacral region: Secondary | ICD-10-CM | POA: Diagnosis not present

## 2020-11-15 DIAGNOSIS — M9905 Segmental and somatic dysfunction of pelvic region: Secondary | ICD-10-CM | POA: Diagnosis not present

## 2020-11-15 DIAGNOSIS — M6240 Contracture of muscle, unspecified site: Secondary | ICD-10-CM | POA: Diagnosis not present

## 2021-05-12 DIAGNOSIS — J0100 Acute maxillary sinusitis, unspecified: Secondary | ICD-10-CM | POA: Diagnosis not present

## 2021-05-12 DIAGNOSIS — Z6831 Body mass index (BMI) 31.0-31.9, adult: Secondary | ICD-10-CM | POA: Diagnosis not present

## 2021-06-01 ENCOUNTER — Other Ambulatory Visit: Payer: Self-pay | Admitting: Sports Medicine

## 2021-06-01 DIAGNOSIS — R0789 Other chest pain: Secondary | ICD-10-CM

## 2021-06-06 ENCOUNTER — Encounter: Payer: Self-pay | Admitting: Sports Medicine

## 2021-06-06 MED ORDER — SCOPOLAMINE 1 MG/3DAYS TD PT72
1.0000 | MEDICATED_PATCH | TRANSDERMAL | 12 refills | Status: DC
Start: 2021-06-06 — End: 2023-07-16

## 2021-06-14 ENCOUNTER — Other Ambulatory Visit: Payer: Self-pay | Admitting: Sports Medicine

## 2021-06-14 DIAGNOSIS — R0789 Other chest pain: Secondary | ICD-10-CM

## 2021-08-15 ENCOUNTER — Other Ambulatory Visit: Payer: Self-pay | Admitting: Sports Medicine

## 2021-08-15 DIAGNOSIS — I10 Essential (primary) hypertension: Secondary | ICD-10-CM

## 2021-08-15 DIAGNOSIS — R0789 Other chest pain: Secondary | ICD-10-CM

## 2021-12-27 DIAGNOSIS — I10 Essential (primary) hypertension: Secondary | ICD-10-CM | POA: Diagnosis not present

## 2021-12-27 DIAGNOSIS — Z87891 Personal history of nicotine dependence: Secondary | ICD-10-CM | POA: Diagnosis not present

## 2021-12-27 DIAGNOSIS — Z79899 Other long term (current) drug therapy: Secondary | ICD-10-CM | POA: Diagnosis not present

## 2021-12-27 DIAGNOSIS — Z7689 Persons encountering health services in other specified circumstances: Secondary | ICD-10-CM | POA: Diagnosis not present

## 2021-12-28 ENCOUNTER — Other Ambulatory Visit: Payer: Self-pay | Admitting: Sports Medicine

## 2021-12-28 DIAGNOSIS — I10 Essential (primary) hypertension: Secondary | ICD-10-CM

## 2023-03-04 DIAGNOSIS — I214 Non-ST elevation (NSTEMI) myocardial infarction: Secondary | ICD-10-CM | POA: Insufficient documentation

## 2023-03-18 DIAGNOSIS — I2510 Atherosclerotic heart disease of native coronary artery without angina pectoris: Secondary | ICD-10-CM | POA: Insufficient documentation

## 2023-06-23 ENCOUNTER — Telehealth: Payer: Self-pay | Admitting: Sports Medicine

## 2023-06-23 NOTE — Telephone Encounter (Signed)
 Yes he has been established with me in the past, lets get him set up for an annual physical.

## 2023-06-23 NOTE — Telephone Encounter (Signed)
 Copied from CRM [id number]. Topic: Appointments - Appointment Scheduling >> Jun 23, 2023 10:56 AM Alessandra Bevels wrote: Patient is calling to schedule an appointment. Patient states that he is a friend of Dr. Benjamin Stain and agreed to take him back on as a New Patient. Please advise  CB- [phone]  Please advise

## 2023-07-07 ENCOUNTER — Encounter: Admitting: Sports Medicine

## 2023-07-16 ENCOUNTER — Ambulatory Visit (INDEPENDENT_AMBULATORY_CARE_PROVIDER_SITE_OTHER): Admitting: Sports Medicine

## 2023-07-16 ENCOUNTER — Encounter: Payer: Self-pay | Admitting: Sports Medicine

## 2023-07-16 VITALS — BP 125/75 | HR 72 | Resp 20 | Ht 75.0 in | Wt 251.1 lb

## 2023-07-16 DIAGNOSIS — I214 Non-ST elevation (NSTEMI) myocardial infarction: Secondary | ICD-10-CM

## 2023-07-16 DIAGNOSIS — E291 Testicular hypofunction: Secondary | ICD-10-CM

## 2023-07-16 DIAGNOSIS — E782 Mixed hyperlipidemia: Secondary | ICD-10-CM

## 2023-07-16 DIAGNOSIS — E790 Hyperuricemia without signs of inflammatory arthritis and tophaceous disease: Secondary | ICD-10-CM

## 2023-07-16 DIAGNOSIS — L219 Seborrheic dermatitis, unspecified: Secondary | ICD-10-CM

## 2023-07-16 DIAGNOSIS — Z1211 Encounter for screening for malignant neoplasm of colon: Secondary | ICD-10-CM

## 2023-07-16 DIAGNOSIS — Z0000 Encounter for general adult medical examination without abnormal findings: Secondary | ICD-10-CM

## 2023-07-16 DIAGNOSIS — M109 Gout, unspecified: Secondary | ICD-10-CM

## 2023-07-16 DIAGNOSIS — I10 Essential (primary) hypertension: Secondary | ICD-10-CM

## 2023-07-16 NOTE — Assessment & Plan Note (Signed)
 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 He does have cardiology follow-up. Ultimately for him keeping his risk factors, blood pressure, blood sugar, cholesterol under control will be critical.

## 2023-07-16 NOTE — Assessment & Plan Note (Signed)
 Under adequate control today.

## 2023-07-16 NOTE — Progress Notes (Addendum)
 Subjective:    CC: Annual Physical Exam  HPI:  This patient is here for their annual physical  I reviewed the past medical history, family history, social history, surgical history, and allergies today and no changes were needed.  Please see the problem list section below in epic for further details.  Past Medical History: History reviewed. No pertinent past medical history. Past Surgical History: History reviewed. No pertinent surgical history. Social History: Social History   Socioeconomic History   Marital status: Single    Spouse name: Not on file   Number of children: Not on file   Years of education: Not on file   Highest education level: Not on file  Occupational History   Not on file  Tobacco Use   Smoking status: Never   Smokeless tobacco: Never  Substance and Sexual Activity   Alcohol use: Not Currently   Drug use: Never   Sexual activity: Not on file  Other Topics Concern   Not on file  Social History Narrative   Not on file   Social Drivers of Health   Financial Resource Strain: Low Risk  (05/27/2023)   Received from [facility]   Overall Financial Resource Strain (CARDIA)    Difficulty of Paying Living Expenses: Not hard at all  Food Insecurity: No Food Insecurity (05/27/2023)   Received from [facility]   Hunger Vital Sign    Worried About Running Out of Food in the Last Year: Never true    Ran Out of Food in the Last Year: Never true  Transportation Needs: No Transportation Needs (05/27/2023)   Received from [facility] - Transportation    Lack of Transportation (Medical): No    Lack of Transportation (Non-Medical): No  Physical Activity: Not on file  Stress: Stress Concern Present (03/04/2023)   Received from [facility] - Occupational Stress Questionnaire    Feeling of Stress : Very much  Social Connections: Not on file   Family History: History reviewed. No pertinent family  history. Allergies: Allergies  Allergen Reactions   Ace Inhibitors Cough   Medications: See med rec.  Review of Systems: No headache, visual changes, nausea, vomiting, diarrhea, constipation, dizziness, abdominal pain, skin rash, fevers, chills, night sweats, swollen lymph nodes, weight loss, chest pain, body aches, joint swelling, muscle aches, shortness of breath, mood changes, visual or auditory hallucinations.  Objective:    General: Well Developed, well nourished, and in no acute distress.  Neuro: Alert and oriented x3, extra-ocular muscles intact, sensation grossly intact. Cranial nerves II through XII are intact, motor, sensory, and coordinative functions are all intact. HEENT: Normocephalic, atraumatic, pupils equal round reactive to light, neck supple, no masses, no lymphadenopathy, thyroid nonpalpable. Oropharynx, nasopharynx, external ear canals are unremarkable. Skin: Warm and dry, no rashes noted.  Cardiac: Regular rate and rhythm, no murmurs rubs or gallops.  Respiratory: Clear to auscultation bilaterally. Not using accessory muscles, speaking in full sentences.  Abdominal: Soft, nontender, nondistended, positive bowel sounds, no masses, no organomegaly.  Musculoskeletal: Shoulder, elbow, wrist, hip, knee, ankle stable, and with full range of motion.  Impression and Recommendations:    The patient was counselled, risk factors were discussed, anticipatory guidance given.  Annual physical exam Annual physical as above, declines vaccinations today. Will get him caught up on labs, colon cancer screening.  Benign essential hypertension Under adequate control today.  NSTEMI (non-ST elevated myocardial infarction) (HCC) Steve Rollins did have an NSTEMI last year,  he had a stent placed. He does have cardiology follow-up. Ultimately for him keeping his risk factors, blood pressure, blood sugar, cholesterol under control will be critical.  Mixed hyperlipidemia Rechecking lipids, if  still not at goal with LDL less than 70 we will switch to rosuvastatin 40.  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   ____________________________________________ Ihor Austin. Benjamin Stain, M.D., ABFM., CAQSM., AME. Primary Care and Sports Medicine [facility]  Adjunct Professor of Family Medicine  [employer]  Restaurant manager, [employer]

## 2023-07-16 NOTE — Assessment & Plan Note (Signed)
 Rechecking lipids, if still not at goal with LDL less than 70 we will switch to rosuvastatin 40.

## 2023-07-16 NOTE — Assessment & Plan Note (Addendum)
 Annual physical as above, declines vaccinations today. Will get him caught up on labs, colon cancer screening.

## 2023-07-17 DIAGNOSIS — L219 Seborrheic dermatitis, unspecified: Secondary | ICD-10-CM | POA: Insufficient documentation

## 2023-07-17 MED ORDER — FLUOCINOLONE ACETONIDE 0.01 % EX SOLN: Freq: Two times a day (BID) | CUTANEOUS | 0 refills | Status: AC

## 2023-07-17 NOTE — Addendum Note (Signed)
 Addended by: Monica Becton on: 07/17/2023 11:59 AM   Modules accepted: Orders

## 2023-07-17 NOTE — Assessment & Plan Note (Addendum)
 Seborrheic dermatitis of the scalp and beard, we will treat aggressively, fluocinolone solution twice daily.

## 2023-07-27 DIAGNOSIS — E790 Hyperuricemia without signs of inflammatory arthritis and tophaceous disease: Secondary | ICD-10-CM | POA: Insufficient documentation

## 2023-07-28 LAB — COMPREHENSIVE METABOLIC PANEL WITH GFR
ALT: 47 IU/L — ABNORMAL HIGH (ref 0–44)
AST: 32 IU/L (ref 0–40)
Albumin: 4.8 g/dL (ref 3.8–4.9)
Alkaline Phosphatase: 103 IU/L (ref 44–121)
BUN/Creatinine Ratio: 17 (ref 9–20)
BUN: 17 mg/dL (ref 6–24)
Bilirubin Total: 0.8 mg/dL (ref 0.0–1.2)
CO2: 22 mmol/L (ref 20–29)
Calcium: 9.7 mg/dL (ref 8.7–10.2)
Chloride: 96 mmol/L (ref 96–106)
Creatinine, Ser: 1.03 mg/dL (ref 0.76–1.27)
Globulin, Total: 2.8 g/dL (ref 1.5–4.5)
Glucose: 101 mg/dL — ABNORMAL HIGH (ref 70–99)
Potassium: 4.1 mmol/L (ref 3.5–5.2)
Sodium: 137 mmol/L (ref 134–144)
Total Protein: 7.6 g/dL (ref 6.0–8.5)
eGFR: 88 mL/min/1.73 (ref >59)

## 2023-07-28 LAB — HEMOGLOBIN A1C
Est. average glucose Bld gHb Est-mCnc: 114 mg/dL
Hgb A1c MFr Bld: 5.6 % (ref 4.8–5.6)

## 2023-07-28 LAB — CBC
Hematocrit: 44.7 % (ref 37.5–51.0)
Hemoglobin: 15.6 g/dL (ref 13.0–17.7)
MCH: 32.2 pg (ref 26.6–33.0)
MCHC: 34.9 g/dL (ref 31.5–35.7)
MCV: 92 fL (ref 79–97)
Platelets: 238 10*3/uL (ref 150–450)
RBC: 4.85 x10E6/uL (ref 4.14–5.80)
RDW: 12.1 % (ref 11.6–15.4)
WBC: 9 10*3/uL (ref 3.4–10.8)

## 2023-07-28 LAB — LIPID PANEL
Chol/HDL Ratio: 3.6 ratio (ref 0.0–5.0)
Cholesterol, Total: 140 mg/dL (ref 100–199)
HDL: 39 mg/dL — ABNORMAL LOW (ref >39)
LDL Chol Calc (NIH): 83 mg/dL (ref 0–99)
Triglycerides: 94 mg/dL (ref 0–149)
VLDL Cholesterol Cal: 18 mg/dL (ref 5–40)

## 2023-07-28 LAB — PSA, TOTAL AND FREE
PSA, Free Pct: 22 %
PSA, Free: 0.11 ng/mL
Prostate Specific Ag, Serum: 0.5 ng/mL (ref 0.0–4.0)

## 2023-07-28 LAB — TESTOSTERONE, FREE, TOTAL, SHBG
Sex Hormone Binding: 26.8 nmol/L (ref 19.3–76.4)
Testosterone, Free: 6.1 pg/mL — ABNORMAL LOW (ref 7.2–24.0)
Testosterone: 153 ng/dL — ABNORMAL LOW (ref 264–916)

## 2023-07-28 LAB — URIC ACID: Uric Acid: 8.5 mg/dL — ABNORMAL HIGH (ref 3.8–8.4)

## 2023-07-28 LAB — TSH: TSH: 1.75 u[IU]/mL (ref 0.450–4.500)

## 2023-07-30 ENCOUNTER — Telehealth: Payer: Self-pay

## 2023-07-30 LAB — COLOGUARD: COLOGUARD: NEGATIVE

## 2023-07-30 NOTE — Telephone Encounter (Signed)
 Spoke with patient.

## 2023-07-30 NOTE — Telephone Encounter (Signed)
 Copied from CRM [id number]. Topic: Clinical - Lab/Test Results >> Jul 30, 2023  9:47 AM Maree Krabbe H wrote: Reason for CRM: Patient was returning a call to discuss his lab results, patients callback number is [phone].

## 2023-12-23 ENCOUNTER — Encounter: Payer: Self-pay | Admitting: Sports Medicine

## 2024-01-16 ENCOUNTER — Ambulatory Visit: Admitting: Sports Medicine
